# Patient Record
Sex: Female | Born: 1995 | Race: White | Hispanic: No | Marital: Single | Smoking: Never smoker
Health system: Southern US, Community
[De-identification: ages and names within clinical notes are randomized; demographics above are authoritative.]

## PROBLEM LIST (undated history)

## (undated) DIAGNOSIS — J45909 Unspecified asthma, uncomplicated: Secondary | ICD-10-CM

## (undated) HISTORY — PX: TONSILLECTOMY: SUR1361

---
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
## 2020-02-01 ENCOUNTER — Ambulatory Visit (HOSPITAL_COMMUNITY): Payer: Self-pay

## 2021-11-02 ENCOUNTER — Emergency Department (HOSPITAL_COMMUNITY)
Admission: EM | Admit: 2021-11-02 | Discharge: 2021-11-02 | Disposition: A | Discharge status: Home / Self Care | Payer: BC Managed Care – PPO | Attending: Emergency Medicine | Admitting: Emergency Medicine

## 2021-11-02 ENCOUNTER — Encounter (HOSPITAL_COMMUNITY): Payer: Self-pay

## 2021-11-02 DIAGNOSIS — K0889 Other specified disorders of teeth and supporting structures: Secondary | ICD-10-CM | POA: Diagnosis not present

## 2021-11-02 DIAGNOSIS — J45909 Unspecified asthma, uncomplicated: Secondary | ICD-10-CM | POA: Diagnosis not present

## 2021-11-02 DIAGNOSIS — R220 Localized swelling, mass and lump, head: Secondary | ICD-10-CM | POA: Diagnosis present

## 2021-11-02 MED ORDER — IBUPROFEN 600 MG PO TABS: 600.0000 mg | ORAL_TABLET | Freq: Four times a day (QID) | ORAL | 0 refills | Status: AC | PRN

## 2021-11-02 MED ORDER — HYDROCODONE-ACETAMINOPHEN 5-325 MG PO TABS
1.0000 | ORAL_TABLET | Freq: Once | ORAL | Status: AC
  Administered 2021-11-02: 1 via ORAL
  Filled 2021-11-02: qty 1

## 2021-11-02 MED ORDER — AMOXICILLIN-POT CLAVULANATE 875-125 MG PO TABS: 1.0000 | ORAL_TABLET | Freq: Two times a day (BID) | ORAL | 0 refills | Status: AC

## 2021-11-02 MED ORDER — HYDROCODONE-ACETAMINOPHEN 5-325 MG PO TABS: 2.0000 | ORAL_TABLET | ORAL | 0 refills | Status: AC | PRN

## 2021-11-02 NOTE — ED Notes (Signed)
Pt is tolerating water and apple sauce

## 2021-11-02 NOTE — Discharge Instructions (Addendum)
Note the work-up today was overall consistent with complications of tooth as we discussed.  We will treat this with oral antibiotics twice daily for the next 10 days.  Recommend close follow-up with your dentist within 1 to 2 days for reevaluation of symptoms.  Take ibuprofen around-the-clock for pain.  You can take pain medicine as needed for breakthrough pain.  Work note attached.  Please not hesitate to return to emergency department for worrisome signs and symptoms we discussed become apparent.

## 2021-11-02 NOTE — ED Provider Triage Note (Signed)
Emergency Medicine Provider Triage Evaluation Note  Terri Curtis , a 26 y.o. female  was evaluated in triage.  Pt complains of left-sided facial swelling.  Patient states that symptoms began insidiously yesterday morning.  She noted left upper wisdom tooth pain prior to onset.  She states that she has been having intermittent pain in the area as they have been emerging.  She was seen in urgent care yesterday who told to come the emergency department for concerns of parotitis, trismus.  Patient currently is complaining of trismus but has been able to eat and drink without difficulty.  She denies any difficulty breathing, fever, chills, night sweats.  Review of Systems  Positive:  Negative:   Physical Exam  BP 131/87 (BP Location: Right Arm)   Pulse (!) 109   Temp 98.7 F (37.1 C) (Oral)   Resp 16   LMP 10/19/2021   SpO2 96%  Gen:   Awake, no distress   Resp:  Normal effort  MSK:   Moves extremities without difficulty  Other:  Left-sided facial swelling with tenderness along posterior aspect of left superior teeth.  No obvious fractured tooth or caries on left upper teeth.  Patient has tenderness to palpation where left upper molar is present.  Trismus noted on exam.  Medical Decision Making  Medically screening exam initiated at 2:35 PM.  Appropriate orders placed.  Terri Curtis was informed that the remainder of the evaluation will be completed by another provider, this initial triage assessment does not replace that evaluation, and the importance of remaining in the ED until their evaluation is complete.     Wilnette Kales, [place] 11/02/21 1437

## 2021-11-02 NOTE — ED Triage Notes (Signed)
Pt presents with c/o facial swelling. Pt reports that she woke up yesterday with the facial swelling. Pt reports that the day before, she was experiencing some dental pain.

## 2021-11-02 NOTE — ED Provider Notes (Signed)
[facility] DEPT Provider Note   CSN: [id number] Arrival date & time: 11/02/21  1332     History  Chief Complaint  Patient presents with   Facial Swelling    Terri Curtis is a 26 y.o. female.  HPI   26 year old female presents emergency department with complaints of left-sided facial swelling.  Patient states that symptoms began insidiously yesterday morning.  She noted left upper wisdom tooth pain prior to onset yesterday.  She states that she has been having intermittent pain in the area as they have been emerging prior to current episode.  She was seen in urgent care yesterday who told to come the emergency department for reevaluation.  Patient currently is complaining of mild trismus but has been able to eat and drink without difficulty.  She denies any difficulty breathing, fever, chills, night sweats, dry mouth, purulent drainage.  Past medical history significant for asthma  Home Medications Prior to Admission medications   Medication Sig Start Date End Date Taking? Authorizing Provider  ALPRAZolam Duanne Moron) 0.25 MG tablet Take 0.25 mg by mouth once as needed for anxiety.   Yes [provider]  amoxicillin-clavulanate (AUGMENTIN) 875-125 MG tablet Take 1 tablet by mouth every 12 (twelve) hours. 11/02/21  Yes Dion Saucier A, PA  HYDROcodone-acetaminophen (NORCO/VICODIN) 5-325 MG tablet Take 2 tablets by mouth every 4 (four) hours as needed. 11/02/21  Yes Dion Saucier A, PA  ibuprofen (ADVIL) 200 MG tablet Take 400 mg by mouth 3 (three) times daily.   Yes [provider]  ibuprofen (ADVIL) 600 MG tablet Take 1 tablet (600 mg total) by mouth every 6 (six) hours as needed. 11/02/21  Yes Dion Saucier A, PA  traMADol (ULTRAM) 50 MG tablet Take 100 mg by mouth once as needed (for dental pain).   Yes [provider]  valACYclovir (VALTREX) 500 MG tablet Take 500 mg by mouth at bedtime.   Yes [provider]   HYDROcodone-acetaminophen (NORCO/VICODIN) 5-325 MG per tablet Take 1 tablet by mouth every 4 (four) hours as needed for moderate pain or severe pain. Patient not taking: Reported on 11/02/2021 12/29/13   Schorr, Rhetta Mura, FNP  ibuprofen (ADVIL,MOTRIN) 600 MG tablet Take 1 tablet (600 mg total) by mouth every 6 (six) hours as needed. Patient not taking: Reported on 11/02/2021 08/05/13   Piepenbrink, Anderson Malta, PA-C  ondansetron (ZOFRAN) 4 MG tablet Take 1 tablet (4 mg total) by mouth every 8 (eight) hours as needed for nausea or vomiting. 08/05/13   Piepenbrink, Anderson Malta, PA-C      Allergies    Patient has no known allergies.    Review of Systems   Review of Systems  All other systems reviewed and are negative.   Physical Exam Updated Vital Signs BP 120/88   Pulse 84   Temp 98.7 F (37.1 C) (Oral)   Resp 16   LMP 10/19/2021   SpO2 99%  Physical Exam Vitals and nursing note reviewed.  Constitutional:      General: She is not in acute distress.    Appearance: She is well-developed.  HENT:     Head: Normocephalic and atraumatic.     Comments: Patient has swelling noticed on the left lateral aspect of face.  No obvious overlying erythema, fluctuance.  Tenderness to palpation along gumline of tooth 16.  No obvious periapical abscess.  No obvious rash or purulent drainage in the oral pharynx.  No edema noted beneath the tongue.  No submandibular swelling noted. No obvious  signs of periapical abscess.  Uvula midline rises symmetrically with phonation.  Tonsils absent from prior tonsillectomy. Eyes:     Extraocular Movements: Extraocular movements intact.     Conjunctiva/sclera: Conjunctivae normal.     Pupils: Pupils are equal, round, and reactive to light.  Cardiovascular:     Rate and Rhythm: Normal rate and regular rhythm.     Heart sounds: No murmur heard. Pulmonary:     Effort: Pulmonary effort is normal. No respiratory distress.     Breath sounds: Normal breath sounds.   Abdominal:     Palpations: Abdomen is soft.     Tenderness: There is no abdominal tenderness.  Musculoskeletal:        General: No swelling.     Cervical back: Neck supple.  Skin:    General: Skin is warm and dry.     Capillary Refill: Capillary refill takes less than 2 seconds.  Neurological:     Mental Status: She is alert.  Psychiatric:        Mood and Affect: Mood normal.     ED Results / Procedures / Treatments   Labs (all labs ordered are listed, but only abnormal results are displayed) Labs Reviewed - No data to display  EKG None  Radiology No results found.  Procedures Procedures    Medications Ordered in ED Medications  HYDROcodone-acetaminophen (NORCO/VICODIN) 5-325 MG per tablet 1 tablet (1 tablet Oral Given 11/02/21 1436)    ED Course/ Medical Decision Making/ A&P Clinical Course as of 11/02/21 1541  Sun Nov 02, 2021  1532 With administration of pain medication, patient able to open jaw fully.  Her tachycardia resolved within normal range. [CR]    Clinical Course User Index [CR] Peter Garter, PA                           Medical Decision Making Risk Prescription drug management.   This patient presents to the ED for concern of dental pain, this involves an extensive number of treatment options, and is a complaint that carries with it a high risk of complications and morbidity.  The differential diagnosis includes Ludwig angina, Lemierre's disease, necrotizing ulcerative gingivitis, dental carry, abscess, parotitis, sialoadenitis, cellulitis, erysipelas, peritonsillar abscess   Co morbidities that complicate the patient evaluation  See HPI   Additional history obtained:  Additional history obtained from EMR External records from outside source obtained and reviewed including urgent care note from earlier today   Lab Tests:  N/a   Imaging Studies ordered:  I offered patient imaging given that she was sent here by the urgent care  although low clinical suspicion of a changing treatment course.  She declined for trial of outpatient therapy of oral antibiotics as well as pain medicine.  She is going to make an appoint with dentist tomorrow for reevaluation.   Cardiac Monitoring: / EKG:  The patient was maintained on a cardiac monitor.  I personally viewed and interpreted the cardiac monitored which showed an underlying rhythm of: Sinus rhythm   Consultations Obtained:  N/a   Problem List / ED Course / Critical interventions / Medication management  Dental pain and facial swelling I ordered medication including Norco for pain   Reevaluation of the patient after these medicines showed that the patient resolved I have reviewed the patients home medicines and have made adjustments as needed   Social Determinants of Health:  Chronic cigarette use.  Denies illicit drug use  Test / Admission - Considered:  Dental pain Vitals signs significant for initial tachycardia with a heart rate of 109 which decreased with administration of pain medication. Otherwise within normal range and stable throughout visit. Laboratory/imaging studies significant for: Considered but after shared decision making with patient and mother, they opted for home trial of oral antibiotics as well as pain medicine. Patient's symptoms likely secondary to dental infection as indicated in HPI.  Patient to be discharged with oral antibiotics as well as oral pain medicine to use as needed.  Close follow-up with dentist recommended in 1 to 2 days for reevaluation of symptoms.  As discussed earlier, I conversed with patient and mother at length regarding imaging modalities for patient's symptoms but they declined secondary to trial at home oral therapy.  Doubt Ludwig angina.  Doubt Lemierre's disease.  Doubt necrotizing ulcerative gingivitis.  Doubt periapical abscess.  Treatment plan discussed along with patient and mother in the long-term same agreeable  to said plan. Worrisome signs and symptoms were discussed with the patient, and the patient acknowledged understanding to return to the ED if noticed. Patient was stable upon discharge.          Final Clinical Impression(s) / ED Diagnoses Final diagnoses:  Pain, dental    Rx / DC Orders ED Discharge Orders          Ordered    HYDROcodone-acetaminophen (NORCO/VICODIN) 5-325 MG tablet  Every 4 hours PRN        11/02/21 1540    ibuprofen (ADVIL) 600 MG tablet  Every 6 hours PRN        11/02/21 1540    amoxicillin-clavulanate (AUGMENTIN) 875-125 MG tablet  Every 12 hours        11/02/21 1540              Peter Garter, [place] 11/02/21 1541    Glyn Ade, MD 11/05/21 1519
